# Patient Record
Sex: Male | Born: 1935 | Race: White | Hispanic: No | Marital: Married
Health system: Southern US, Community
[De-identification: ages and names within clinical notes are randomized; demographics above are authoritative.]

---

## 2003-10-25 ENCOUNTER — Ambulatory Visit (HOSPITAL_COMMUNITY): Admission: RE | Admit: 2003-10-25 | Discharge: 2003-10-26 | Payer: Self-pay | Admitting: Otolaryngology

## 2005-02-18 ENCOUNTER — Ambulatory Visit: Payer: Self-pay | Admitting: Internal Medicine

## 2005-03-20 ENCOUNTER — Ambulatory Visit: Payer: Self-pay | Admitting: Internal Medicine

## 2007-03-25 ENCOUNTER — Ambulatory Visit: Payer: Self-pay | Admitting: Internal Medicine

## 2009-08-02 ENCOUNTER — Ambulatory Visit: Payer: Self-pay | Admitting: Internal Medicine

## 2009-08-09 ENCOUNTER — Ambulatory Visit: Payer: Self-pay | Admitting: Internal Medicine

## 2009-08-16 ENCOUNTER — Ambulatory Visit: Payer: Self-pay | Admitting: Internal Medicine

## 2009-08-30 ENCOUNTER — Ambulatory Visit: Payer: Self-pay | Admitting: Internal Medicine

## 2012-11-26 ENCOUNTER — Emergency Department: Payer: Self-pay | Admitting: Emergency Medicine

## 2012-11-26 LAB — COMPREHENSIVE METABOLIC PANEL
Albumin: 3.7 g/dL (ref 3.4–5.0)
Calcium, Total: 10.1 mg/dL (ref 8.5–10.1)
Co2: 31 mmol/L (ref 21–32)
EGFR (Non-African Amer.): 36 — ABNORMAL LOW
Glucose: 161 mg/dL — ABNORMAL HIGH (ref 65–99)
Osmolality: 286 (ref 275–301)
Sodium: 139 mmol/L (ref 136–145)
Total Protein: 7.9 g/dL (ref 6.4–8.2)

## 2012-11-26 LAB — URINALYSIS, COMPLETE
Bacteria: NONE SEEN
Hyaline Cast: 4
Leukocyte Esterase: NEGATIVE
Ph: 5 (ref 4.5–8.0)
Squamous Epithelial: <1

## 2012-11-26 LAB — CBC
HCT: 41.8 % (ref 40.0–52.0)
HGB: 14 g/dL (ref 13.0–18.0)
MCH: 30.3 pg (ref 26.0–34.0)
MCHC: 33.5 g/dL (ref 32.0–36.0)
MCV: 91 fL (ref 80–100)

## 2013-03-06 ENCOUNTER — Inpatient Hospital Stay: Payer: Self-pay | Admitting: Internal Medicine

## 2013-03-06 LAB — URINALYSIS, COMPLETE
Bilirubin,UR: NEGATIVE
Blood: NEGATIVE
Glucose,UR: NEGATIVE mg/dL (ref 0–75)
Nitrite: NEGATIVE
Specific Gravity: 1.01 (ref 1.003–1.030)
Squamous Epithelial: <1

## 2013-03-06 LAB — LIPASE, BLOOD: Lipase: 148 U/L (ref 73–393)

## 2013-03-06 LAB — CK
CK, Total: 44 U/L (ref 35–232)
CK, Total: 39 U/L (ref 35–232)

## 2013-03-06 LAB — COMPREHENSIVE METABOLIC PANEL
Alkaline Phosphatase: 127 U/L (ref 50–136)
Anion Gap: 4 — ABNORMAL LOW (ref 7–16)
Bilirubin,Total: 0.3 mg/dL (ref 0.2–1.0)
Chloride: 108 mmol/L — ABNORMAL HIGH (ref 98–107)
Co2: 29 mmol/L (ref 21–32)
Creatinine: 1.58 mg/dL — ABNORMAL HIGH (ref 0.60–1.30)
Osmolality: 295 (ref 275–301)
Potassium: 4.3 mmol/L (ref 3.5–5.1)
SGOT(AST): 18 U/L (ref 15–37)
Total Protein: 6.6 g/dL (ref 6.4–8.2)

## 2013-03-06 LAB — CBC
HCT: 37 % — ABNORMAL LOW (ref 40.0–52.0)
HGB: 11.8 g/dL — ABNORMAL LOW (ref 13.0–18.0)
MCH: 28.5 pg (ref 26.0–34.0)
Platelet: 200 10*3/uL (ref 150–440)
RBC: 4.15 10*6/uL — ABNORMAL LOW (ref 4.40–5.90)
RDW: 16.8 % — ABNORMAL HIGH (ref 11.5–14.5)

## 2013-03-06 LAB — BASIC METABOLIC PANEL
Anion Gap: 3 — ABNORMAL LOW (ref 7–16)
Chloride: 110 mmol/L — ABNORMAL HIGH (ref 98–107)
Co2: 30 mmol/L (ref 21–32)
Glucose: 164 mg/dL — ABNORMAL HIGH (ref 65–99)
Potassium: 4.5 mmol/L (ref 3.5–5.1)
Sodium: 143 mmol/L (ref 136–145)

## 2013-03-06 LAB — HEMATOCRIT: HCT: 36.2 % — ABNORMAL LOW (ref 40.0–52.0)

## 2013-03-06 LAB — TROPONIN I: Troponin-I: 0.08 ng/mL — ABNORMAL HIGH

## 2013-03-06 LAB — CK-MB: CK-MB: 2.1 ng/mL (ref 0.5–3.6)

## 2013-03-06 LAB — HEMOGLOBIN: HGB: 11.7 g/dL — ABNORMAL LOW (ref 13.0–18.0)

## 2013-03-06 LAB — MAGNESIUM: Magnesium: 1.1 mg/dL — ABNORMAL LOW

## 2013-03-06 LAB — PROTIME-INR: Prothrombin Time: 15 secs — ABNORMAL HIGH (ref 11.5–14.7)

## 2013-03-07 LAB — BASIC METABOLIC PANEL
Anion Gap: 4 — ABNORMAL LOW (ref 7–16)
Calcium, Total: 9.5 mg/dL (ref 8.5–10.1)
Co2: 28 mmol/L (ref 21–32)
EGFR (Non-African Amer.): 35 — ABNORMAL LOW
Glucose: 156 mg/dL — ABNORMAL HIGH (ref 65–99)

## 2013-03-07 LAB — CBC WITH DIFFERENTIAL/PLATELET
HCT: 34.3 % — ABNORMAL LOW (ref 40.0–52.0)
HGB: 10.8 g/dL — ABNORMAL LOW (ref 13.0–18.0)
Lymphocyte #: 1.1 10*3/uL (ref 1.0–3.6)
Lymphocyte %: 3.9 %
Monocyte #: 1.1 x10 3/mm — ABNORMAL HIGH (ref 0.2–1.0)
Monocyte %: 3.8 %
RDW: 17.1 % — ABNORMAL HIGH (ref 11.5–14.5)
WBC: 27.6 10*3/uL — ABNORMAL HIGH (ref 3.8–10.6)

## 2013-03-08 LAB — CBC WITH DIFFERENTIAL/PLATELET
Basophil #: 0 10*3/uL (ref 0.0–0.1)
Eosinophil #: 0 10*3/uL (ref 0.0–0.7)
Lymphocyte #: 0.5 10*3/uL — ABNORMAL LOW (ref 1.0–3.6)
Lymphocyte %: 2.5 %
MCV: 88 fL (ref 80–100)
Monocyte #: 0.8 x10 3/mm (ref 0.2–1.0)
WBC: 19 10*3/uL — ABNORMAL HIGH (ref 3.8–10.6)

## 2013-03-09 LAB — BASIC METABOLIC PANEL
Calcium, Total: 9.2 mg/dL (ref 8.5–10.1)
Creatinine: 1.8 mg/dL — ABNORMAL HIGH (ref 0.60–1.30)
EGFR (African American): 39 — ABNORMAL LOW
Glucose: 195 mg/dL — ABNORMAL HIGH (ref 65–99)
Sodium: 138 mmol/L (ref 136–145)

## 2013-03-09 LAB — CBC WITH DIFFERENTIAL/PLATELET
Basophil %: 0.2 %
HGB: 9.4 g/dL — ABNORMAL LOW (ref 13.0–18.0)
Lymphocyte %: 3.6 %
MCH: 28.8 pg (ref 26.0–34.0)
Monocyte #: 0.8 x10 3/mm (ref 0.2–1.0)
Platelet: 159 10*3/uL (ref 150–440)
WBC: 15.4 10*3/uL — ABNORMAL HIGH (ref 3.8–10.6)

## 2013-03-20 DEATH — deceased

## 2013-05-01 LAB — CULTURE, BLOOD (SINGLE)

## 2014-05-12 IMAGING — CT CT ABD-PELV W/O CM
1 of 2 series · 15 of 32 positions shown, 19 images · non-contrast
Comparison: none

REASON FOR EXAM: (1) Pain, r/o perf, distended, ?sbo; (2) pain, r/o perf
COMMENTS:

[Series 2: 3mm soft tissue · axial · 0.74mm/px · z∈[-494,-82]mm · 15 of 151 slices shown, 19 images]
[im 7/151  soft-tissue]
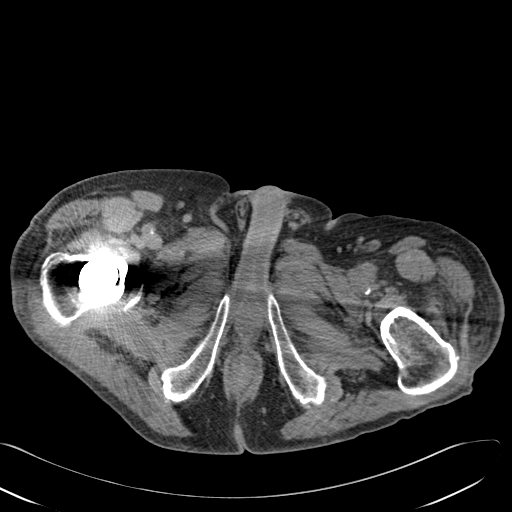
[im 7/151  bone]
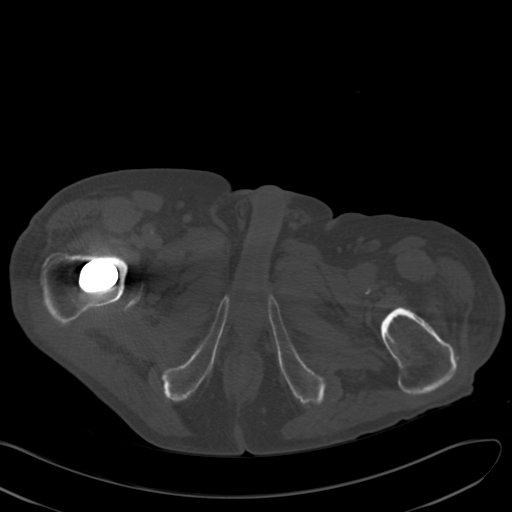
[im 21/151  soft-tissue]
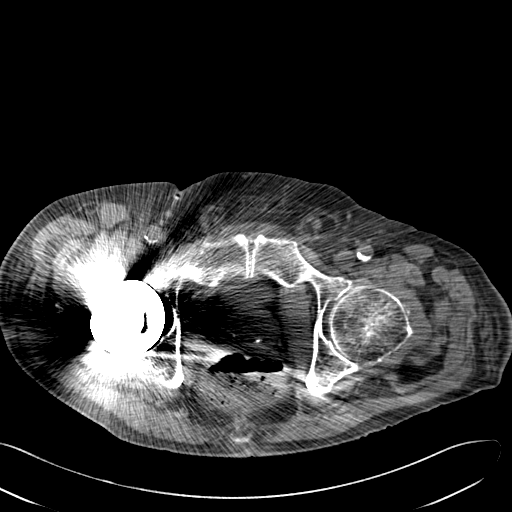
[im 35/151  soft-tissue]
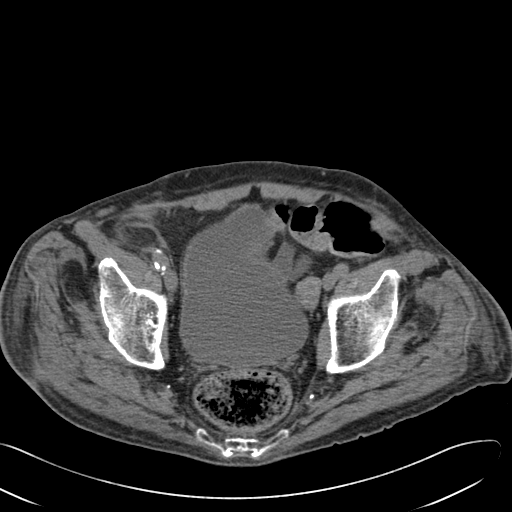
[im 41/151  soft-tissue]
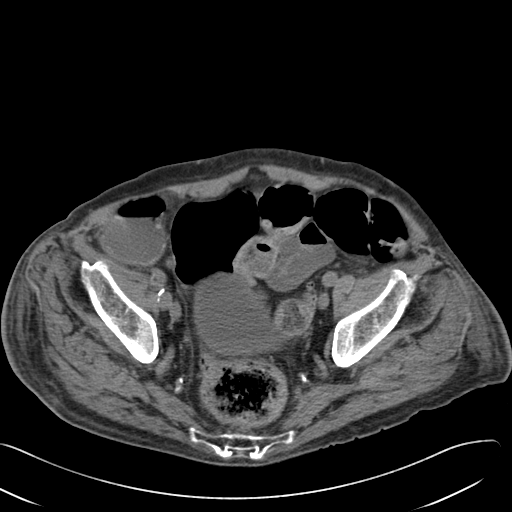
[im 55/151  soft-tissue]
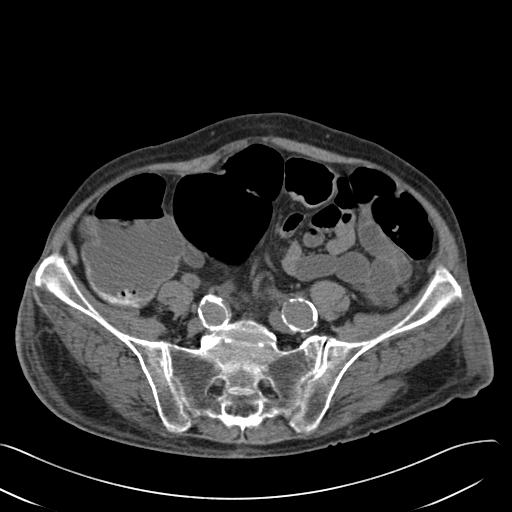
[im 62/151  soft-tissue]
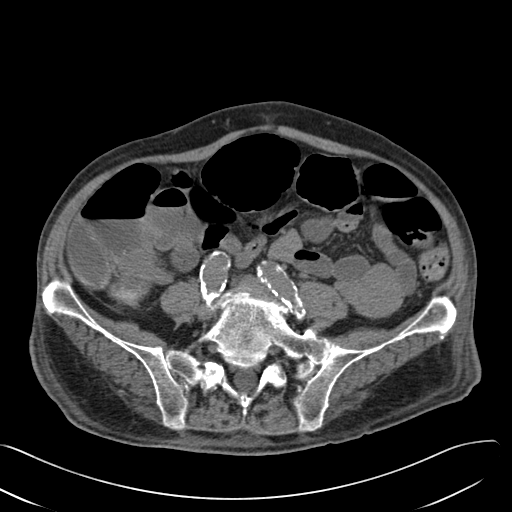
[im 76/151  soft-tissue]
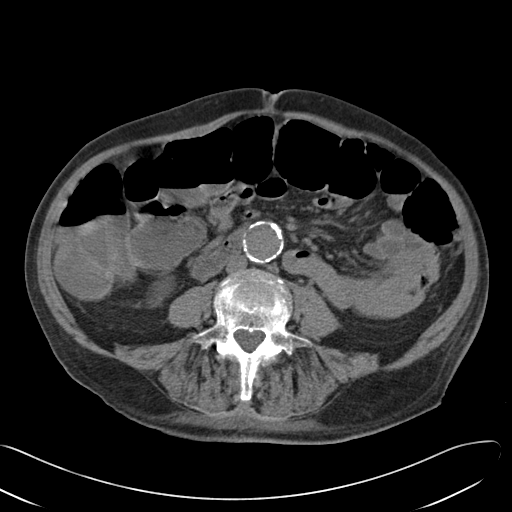
[im 89/151  soft-tissue]
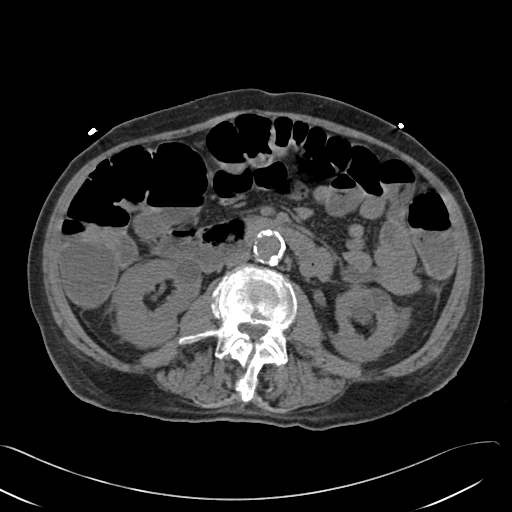
[im 96/151  soft-tissue]
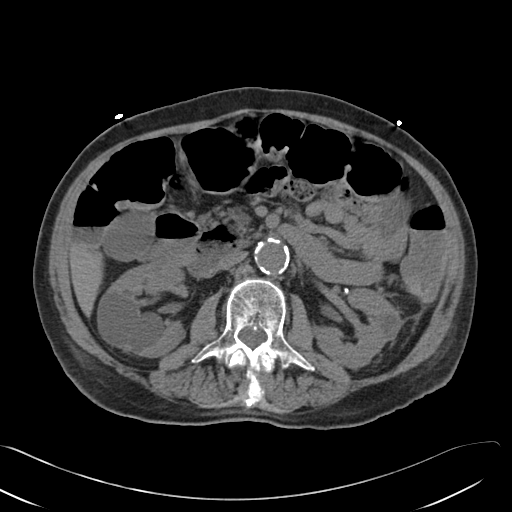
[im 96/151  bone]
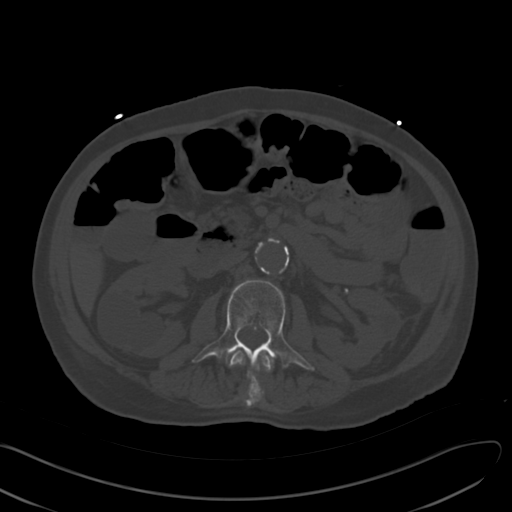
[im 110/151  soft-tissue]
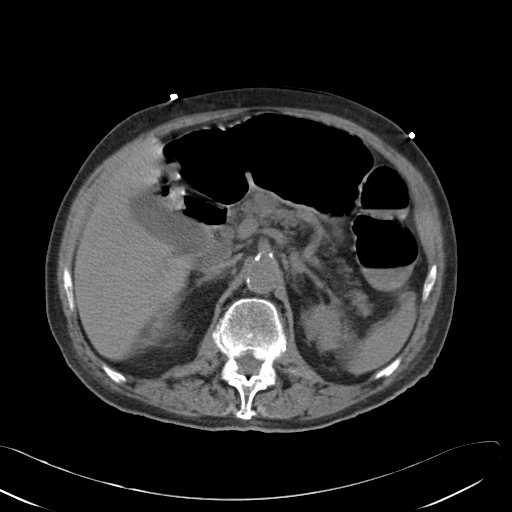
[im 116/151  soft-tissue]
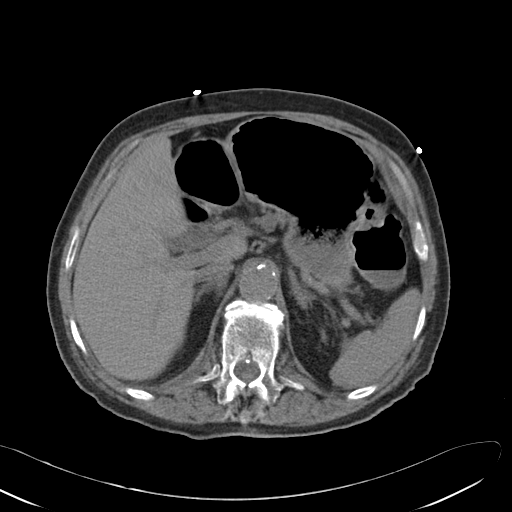
[im 123/151  lung]
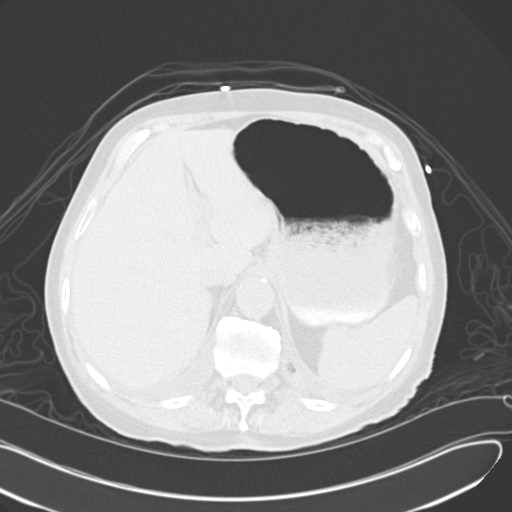
[im 130/151  soft-tissue]
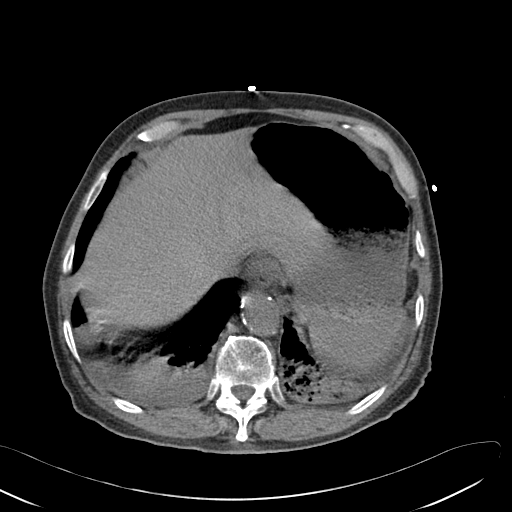
[im 130/151  lung]
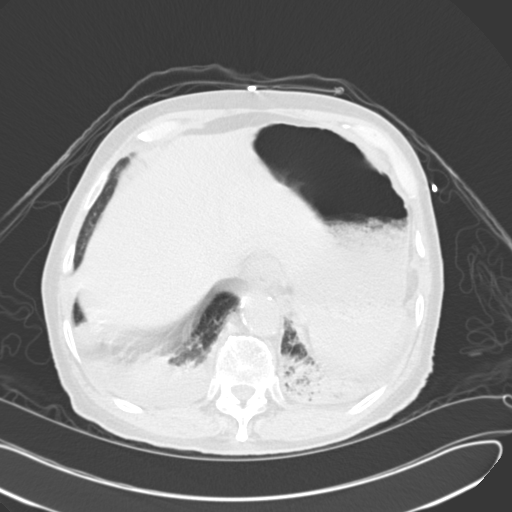
[im 137/151  lung]
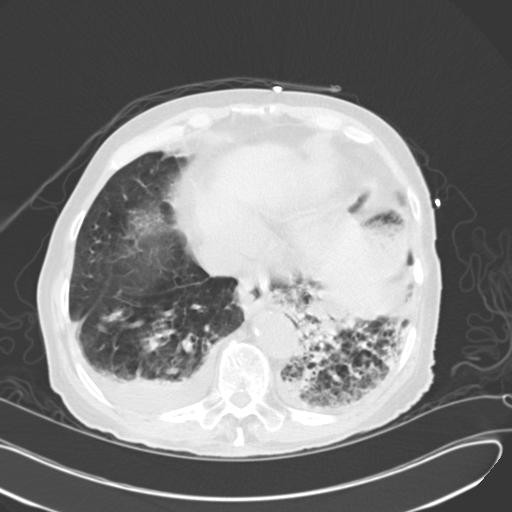
[im 144/151  soft-tissue]
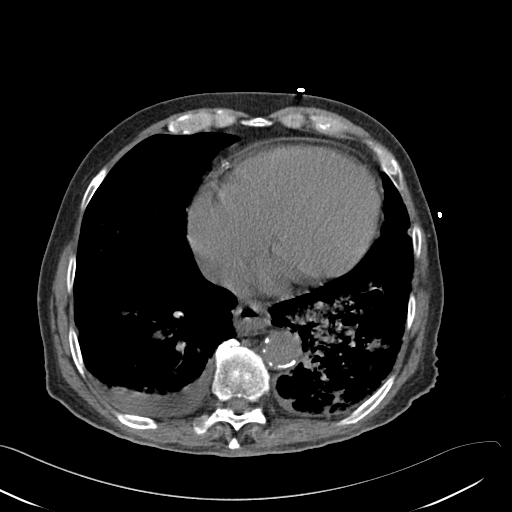
[im 144/151  lung]
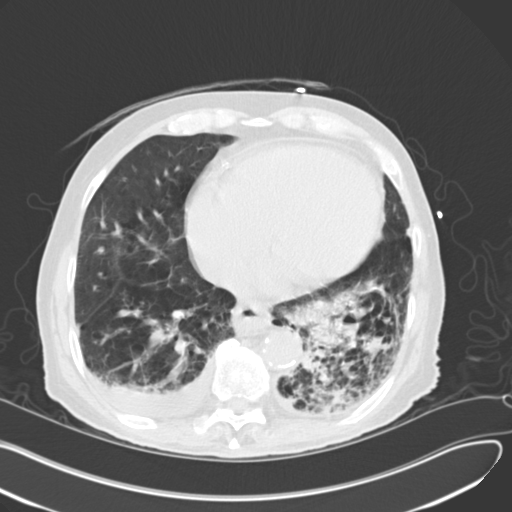

[15 of 32 positions shown; findings below may reference images not displayed]

PROCEDURE:     CT  - CT ABDOMEN AND PELVIS W[DATE]  [DATE]

RESULT:     CT of the abdomen and pelvis without oral nor IV contrast is
reconstructed at 3 mm slice thickness in the axial plane and read
millimeters slice thickness in the coronal plane. The patient has no
previous study for comparison.

A images through the base the lungs demonstrate heterogeneous opacification
in the left lower lobe with a trace to small right pleural effusion and
possible minimal left pleural fluid. Less prominent areas of consolidation
are seen in the right lung base. There is respiratory motion artifact. There
is no pneumothorax.

There is no evidence of pneumoperitoneum. Gaseous distention of the stomach
with some mildly prominent small bowel loops and air and fluid in the colon
are seen. There is a moderate amount of fecal material in the rectum and
rectosigmoid region. There is no significant ascites. There is no
adenopathy. There is a small infrarenal abdominal aortic aneurysm measuring
approximately 3 cm. Heavy atherosclerotic calcification is present. There is
evidence of an cyst in the posterior and anterior mid right kidney possibly
with a lateral cyst as well. Another cyst is seen in the upper pole the
right kidney. There multiple cysts involving the left kidney that are
slightly smaller than the largest cyst on the right. The liver and spleen
appear grossly normal although there is motion artifact area the pancreas
and adrenal glands as well as the gallbladder appear grossly normal. No
bowel wall thickening or ascites is appreciated. There is a moderately large
amount of urine in the urinary bladder.
IMPRESSION: 1. Multiple bilateral renal cysts no hydronephrosis or nephrolithiasis to
the appendix is not seen but there is no definite evidence of acute
appendicitis.
3. Probable ileus. A definite obstruction is not appreciated at this time.
4. Small infrarenal suprailiac abdominal aortic aneurysm 3 cm with
significant atherosclerotic calcification present.

[REDACTED]

## 2014-05-13 IMAGING — CR DG CHEST 1V
1 series · 1 of 1 positions shown · non-contrast
Comparison: none

REASON FOR EXAM: Infiltrates?
COMMENTS:

[ap]
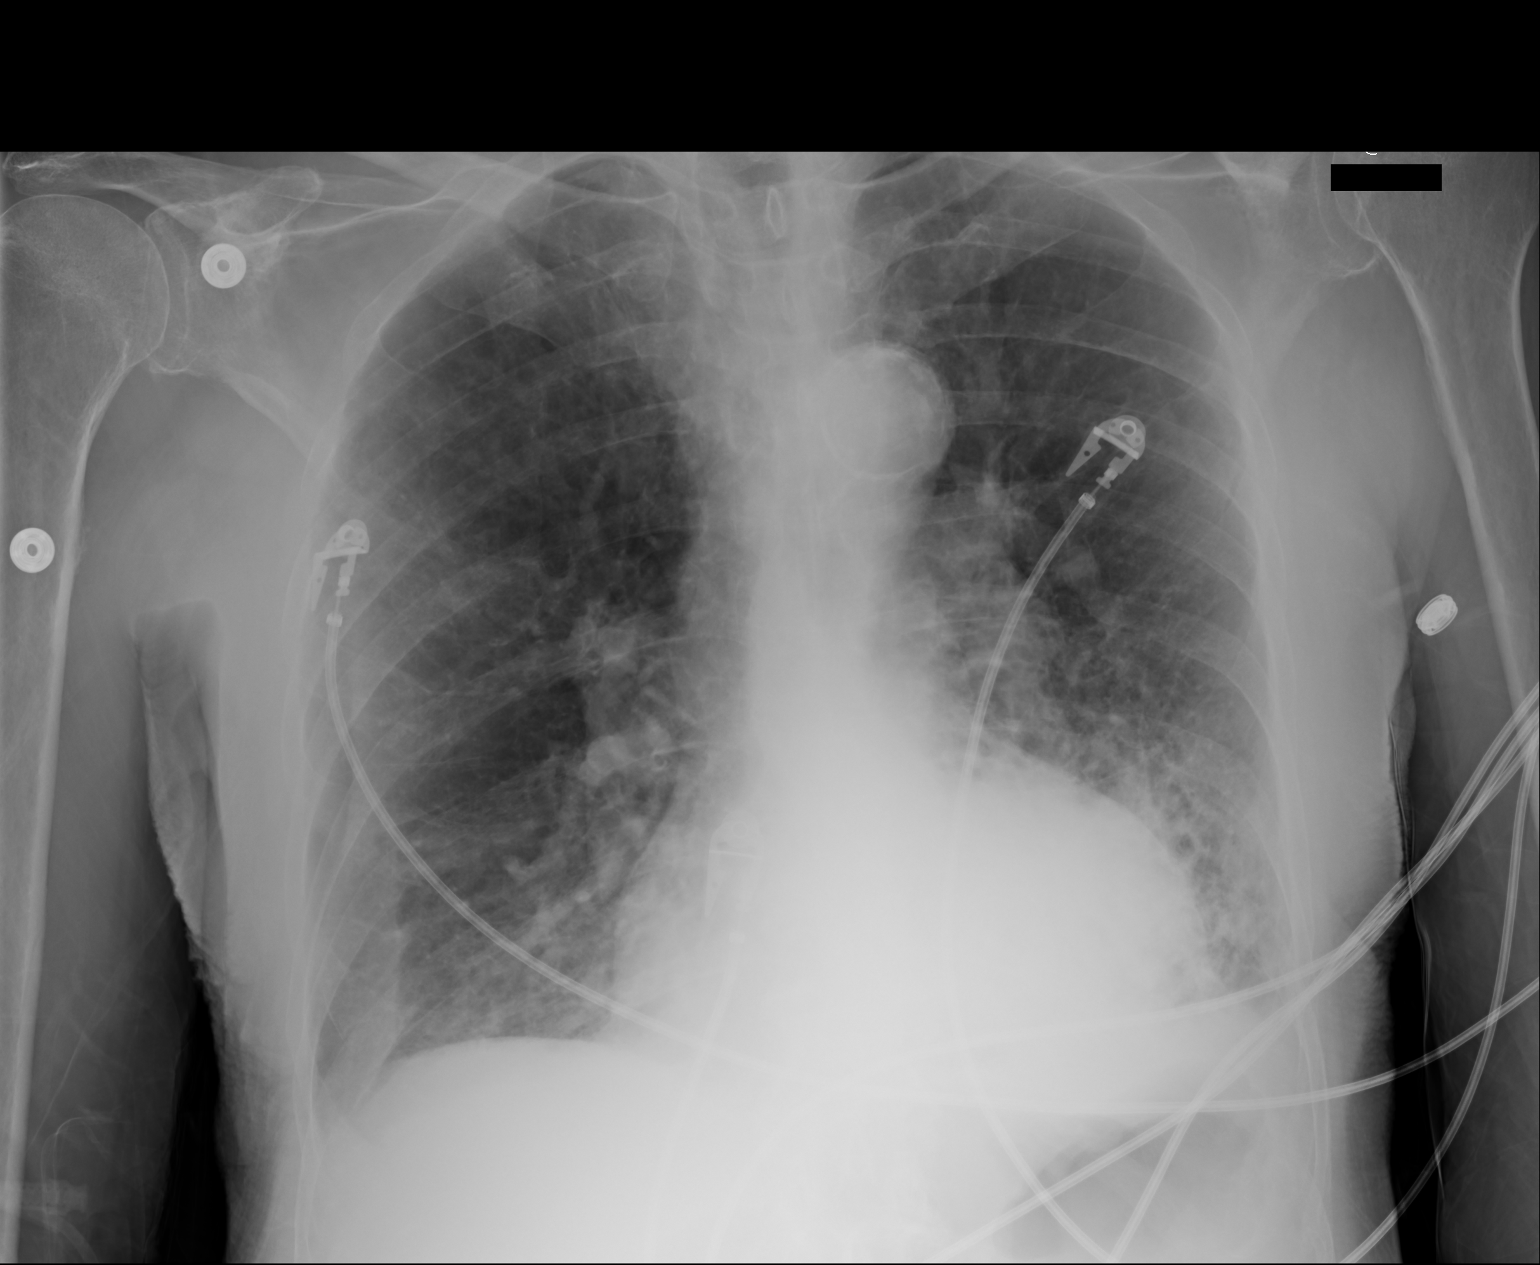

[1 of 1 positions shown; findings below may reference images not displayed]

PROCEDURE:     DXR - DXR CHEST 1 VIEWAP OR PA  - March 07, 2013 [DATE]

RESULT:     Comparison is made to the study of 11/26/2012.

There is diffuse interstitial thickening which could be edematous or
fibrotic. The heart is mildly enlarged. Atherosclerotic calcification is
present. There is no large effusion or consolidation.
IMPRESSION: COPD with fibrosis. Mild interstitial edema or an
infectious or inflammatory process is not excluded.

[REDACTED]

## 2014-09-09 NOTE — Discharge Summary (Signed)
PATIENT NAME:  Leroy Nielsen, Leroy Nielsen MR#:  [id number] DATE OF BIRTH:  [birth year]  DATE OF ADMISSION:  03/06/2013 DATE OF DISCHARGE:  06-02-12  DISCHARGE DIAGNOSES: 1.  Aspiration pneumonia.  2.  Chronic obstructive pulmonary disease, chronic respiratory failure, needs 3 liter oxygen, as baseline.  3.  Atrial fibrillation.  4.  Gastrointestinal bleed, acute blood loss anemia.  5.  Chronic kidney disease stage 3.    6.  Electrolyte imbalance.   CONDITION ON DISCHARGE: Stable.   CODE STATUS: DO NOT RESUSCITATE.   DISCHARGE MEDICATIONS: 1.  Tamsulosin 0.4 mg oral capsule once a day.  2.  Escitalopram 10 mg oral tablet once a day.  3.  Theophylline 300 mg 24-hour capsule once a day.  4.  Vitamin C 500 mg 2 times a day.  5.  KCl at 1 tablet 2 times a day.  6.  Metoprolol 75 mg once a day.  7.  Ferrous glucose 325 mg once a day.  8.  Multivitamin once a day.  9.  Senna 2 tablets once a day.  10.  Lumigan 0.01% ophthalmic solution, 1 drop each eye once a day.  11.  Tramadol 50 mg oral 4 times a day as needed for pain.  12.  ProAir 1 puff inhalation every 4 hours as needed for wheezing.  12.  Alphagan 15% ophthalmic solution, 1 drop each eye 3 times a day.  13.  DuoNeb 2.5 3 mL inhalation 4 times a day.  14.  Advair Diskus 250/50 mcg 1 puff 2 times a day.  15.  Neurontin 300 mg oral capsule once a day.  16.  Levaquin 750 mg oral tablet every 48 hours for 3 days.  17.  Oxygen delivery 3 liters nasal cannula.   DIET: Regular, diet consistency regular.   ACTIVITY: As tolerated.    TIME FRAME TO FOLLOWUP:  1 to 2 weeks.    ____________________________ Hope PigeonVaibhavkumar G. Elisabeth PigeonVachhani, MD vgv:cs D: 06-02-12 14:36:00 ET Nielsen: 06-02-12 15:01:53 ET JOB#: [id number]  cc: Hope PigeonVaibhavkumar G. Elisabeth PigeonVachhani, MD, <Dictator> Altamese DillingVAIBHAVKUMAR Dennise Raabe MD ELECTRONICALLY SIGNED 03/17/2013 13:58

## 2014-09-09 NOTE — H&P (Signed)
PATIENT NAME:  Leroy Nielsen, Leroy Nielsen MR#:  [id number] DATE OF BIRTH:  Oct 13, 1927  DATE OF ADMISSION:  03/06/2013  PRIMARY CARE PHYSICIAN: Vernie Shanks.  REFERRING PHYSICIAN: Caryn Bee A. Paduchowski, MD  HISTORY OF PRESENT ILLNESS: The patient is an 81 year old Caucasian male with past medical history of chronic atrial fibrillation, is on Eliquis, end-stage emphysema, on 3 liters of home oxygen, hypertension, diabetes mellitus, who was sent over to the emergency room after he has been vomiting blood. His symptoms started yesterday afternoon, and he has vomited at least 12 times in the past 10 hours. After the patient had 2 large-volume hematemesis episodes, nursing home called family, and EMS was called, and the patient is sent over to the ER. The patient's stool was strongly guaiac-positive. There is no dizziness or loss of consciousness. The patient denies any abdominal pain. The patient is a poor historian and lethargic. No family members are at bedside during my examination. His hematemesis was assumed to be from Eliquis, which he has been taking for chronic atrial fibrillation. The patient's hemoglobin is at 11.8 and hematocrit is 37.0. As the patient's abdomen is rigid and has tympanic as well as decreased bowel sounds, CAT scan of the abdomen and pelvis was ordered, which has revealed ileus, but no obstruction or free air. The patient is a poor historian, and I was unable to get good history from the patient.   PAST MEDICAL HISTORY:  1. End-stage COPD with emphysema, with chronic respiratory failure, lives on 3 liters of oxygen via nasal cannula.  2. Diabetes mellitus.  3. Benign prostatic hypertrophy.  4. Chronic atrial fibrillation, recently started on Eliquis. 5. Hypertension.   PAST SURGICAL HISTORY: Coronary artery bypass grafting.   ALLERGIES: HE IS ALLERGIC TO DEMEROL AND PENICILLIN.   PSYCHOSOCIAL HISTORY: Residing at nursing home. No history of smoking, alcohol or illicit drug usage.    FAMILY HISTORY: Hypertension runs in his family.   HOME MEDICATIONS:  1. Tylenol 325 mg 2 tablets p.o. q.4 hours.  2. Tramadol 50 mg 4 times a day as needed for pain. 3. Tamsulosin 0.4 mg 1 capsule p.o. once daily.  4. Theophylline 1 capsule p.o. once daily. 5. Protonix 40 mg once daily.  6. ProAir 2 puff inhalation q.4 hours as needed. 7. Neurontin 300 mg once a day.  8. Metoprolol 75 mg once daily.  9. Hydralazine 25 mg 3 times a day.  10. Citalopram 10 mg once daily. 11. Eliquis 2.5 mg 1 tablet p.o. 2 times a day. 12. Avodart 0.5 mg 1 capsule p.o. once daily.  13. Aspirin 81 mg once daily.  14. Advair 250/50 one puff inhalation 2 times a day.  REVIEW OF SYSTEMS: CONSTITUTIONAL: Denies any fever, but complaining of fatigue and weakness.  ENT: Denies epistaxis, discharge.  RESPIRATION: Denies cough. Has COPD.  CARDIOVASCULAR: No chest pain or palpitations.  GASTROINTESTINAL: Complaining of bloody vomiting. No nausea. No abdominal pain. No melena, but heme-positive stool.  GENITOURINARY: No dysuria or hematuria.  ENDOCRINE: Denies polyuria or nocturia. Has positive diabetes mellitus.  HEMATOLOGIC AND LYMPHATIC: No anemia, bruising or bleeding.  MUSCULOSKELETAL: No joint pain in neck and back. Denies gout.  NEUROLOGIC: No vertigo or ataxia.  PSYCHIATRIC: Denies ADD, OCD.   PHYSICAL EXAMINATION:  VITAL SIGNS: Temperature 99.5, pulse 102, respiration 26, blood pressure 149/55, pulse oximetry 96%.  GENERAL APPEARANCE: Not under acute distress. Moderately built and nourished.  HEENT: Normocephalic, atraumatic. Pupils are equally reacting to light and accommodation. No scleral icterus. No conjunctival injection.  No sinus tenderness. Moist mucous membranes.  NECK: Supple. No JVD. No thyromegaly.  LUNGS: Positive crackles and rales.  CARDIAC: Irregularly irregular. No murmurs.  GASTROINTESTINAL: Rigid, distended. Decreased bowel sounds. Bowel sounds are tympanic in nature. No  abdominal tenderness. No guarding.  NEUROLOGIC: Awake and alert, but lethargic. Answers most of the questions appropriately, but poor historian. Reflexes are 2+. Motor and sensory are grossly intact.  EXTREMITIES: No edema. No cyanosis. No clubbing.  MUSCULOSKELETAL: No joint effusion, tenderness or erythema.  SKIN: Warm to touch. Normal turgor. No rashes. No lesions  VASCULAR: Palpable dorsalis pedis and posterior tibialis pulses.  PSYCHIATRIC: Flat mood and affect.   LABORATORY AND IMAGING STUDIES: A 12-lead EKG: Sinus tachycardia with frequent premature ventricular complexes, left axis deviation, right bundle branch block. CAT scan of the abdomen and pelvis without IV contrast has revealed gas-filled loops of large and small bowels which may represent ileus. No obstruction or free air. Right-sided effusion. Patchy consolidation in both lung bases, greater on the left than on the right, which may represent atelectasis or pneumonia. LFTs are normal except albumin which is low at 2.9. CK 51, CPK-MB 3.8. WBC 18.3, hemoglobin 11.8, hematocrit 37.0, platelets 200, O positive, antibody negative. PT 15.0, INR 1.2. Glucose 254, BUN 28, creatinine 1.58, sodium 141, potassium 4.3, chloride 108, CO2 29, GFR 40, anion gap 4, serum osmolality 295, calcium 9.7, lipase 148.   ASSESSMENT AND PLAN: An 81 year old Caucasian male brought into the ER from nursing home with 10 to 12 episodes of hematemesis and 2 large voluminous episodes of hematemesis in the last 10 hours. Will be admitted with the following assessment and plan.   1. Upper gastrointestinal bleed probably from Eliquis, which has prolonged half-life. Will discontinue Eliquis.  Will keep him n.p.o. Provide him IV fluids and Protonix drip.  Monitor hemoglobin and hematocrit q.8 hours.  GI consult is placed to Dr. Marva PandaSkulskie and notified. He is aware. He will see the patient as soon as possible.  2. Aspiration pneumonia. Will provide him meropenem and  Levaquin. Cultures are ordered.  3. Detectable troponin, probably from demand ischemia. Will monitor cardiac enzymes. The patient denies any chest pain.  4. Ileus. The patient is not currently vomiting. His last bowel movement was yesterday. Will monitor closely, and if necessary, will put a surgical consult. Will decompress with nasogastric tube as-needed basis.  5. Chronic atrial fibrillation, currently rate controlled. Eliquis is discontinued in view of hematemesis.  6. Type 2 diabetes mellitus. The patient being n.p.o., will be on sliding scale insulin.   CODE STATUS: He is DNR.   TOTAL CRITICAL CARE TIME SPENT: 50 minutes.   ____________________________ Ramonita LabAruna Sacha Radloff, MD ag:lb D: 03/06/2013 08:12:00 ET T: 03/06/2013 09:00:35 ET JOB#: [id number]  cc: Ramonita LabAruna Eladia Frame, MD, <Dictator> Lovena NeighboursBarbara Matthews Martin U. Skulskie, MD Ramonita LabARUNA Naja Apperson MD ELECTRONICALLY SIGNED 03/19/2013 1:58

## 2014-09-09 NOTE — Consult Note (Signed)
Chief Complaint:  Subjective/Chief Complaint seen for hematemesis.  not recurrent, hemodynamically stable.  denies abdominal pain or nausea, tolerating clears.   VITAL SIGNS/ANCILLARY NOTES: **Vital Signs.:   20-Oct-14 17:03  Vital Signs Type Routine  Temperature Temperature (F) 97.3  Celsius 36.2  Temperature Source oral  Pulse Pulse 97  Respirations Respirations 20  Systolic BP Systolic BP 106  Diastolic BP (mmHg) Diastolic BP (mmHg) 55  Mean BP 72  Pulse Ox % Pulse Ox % 90  Pulse Ox Activity Level  At rest  Oxygen Delivery 3L  *Intake and Output.:   20-Oct-14 03:15  Stool  MODERATE LOOSE STOOL.  NO SIGNS OF BLOOD.    12:29  Stool  Patient had a small loose bowel movement.    13:26  Stool  Patient had a small loose bowel movement.    17:44  Stool  Patient had a medium sized loose dark bowel movement.   Brief Assessment:  Cardiac Irregular   Respiratory coarse rhonchi/wheezes   Gastrointestinal details normal Soft  Nontender  Nondistended  Bowel sounds normal   Lab Results: Routine Hem:  20-Oct-14 06:44   WBC (CBC)  19.0  RBC (CBC)  3.28  Hemoglobin (CBC)  9.4  Hematocrit (CBC)  29.0  Platelet Count (CBC) 150  MCV 88  MCH 28.6  MCHC 32.3  RDW  16.9  Neutrophil % 93.0  Lymphocyte % 2.5  Monocyte % 4.1  Eosinophil % 0.1  Basophil % 0.3  Neutrophil #  17.6  Lymphocyte #  0.5  Monocyte # 0.8  Eosinophil # 0.0  Basophil # 0.0 (Result(s) reported on 08 Mar 2013 at 07:01AM.)   Assessment/Plan:  Assessment/Plan:  Assessment 1) hematemesis-not recurrent.  dark stool not melena.  hemodynamically stable.  tolerating clears   Plan 1) advance diet to full liquids in am, continue bid ppi, possible d/c tomorrow.  will need o/p gi fu to arrange upper gi series in about 2 weeks.   Electronic Signatures: Barnetta ChapelSkulskie, Kaitlen Redford (MD)  (Signed 20-Oct-14 21:22)  Authored: Chief Complaint, VITAL SIGNS/ANCILLARY NOTES, Brief Assessment, Lab Results, Assessment/Plan   Last  Updated: 20-Oct-14 21:22 by Barnetta ChapelSkulskie, Tymar Polyak (MD)

## 2014-09-09 NOTE — Consult Note (Signed)
Chief Complaint:  Subjective/Chief Complaint seen for hematemesis.  no recurrent emesis. one large loose bm, brown.   n/v or abd pain.   VITAL SIGNS/ANCILLARY NOTES: **Vital Signs.:   19-Oct-14 07:00  Temperature Temperature (F) 97.6  Celsius 36.4  Temperature Source oral  Pulse Pulse 110  Respirations Respirations 21  Pulse Ox % Pulse Ox % 97  Oxygen Delivery 3L; Nasal Cannula  Pulse Ox Heart Rate 100  *Intake and Output.:   19-Oct-14 10:07  Stool  large loose brn   Brief Assessment:  Cardiac Irregular   Respiratory clear BS   Gastrointestinal details normal Soft  Nontender  Nondistended  Bowel sounds normal   Lab Results: Routine Chem:  18-Oct-14 00:34   BUN  28    14:38   BUN  31  19-Oct-14 04:59   Magnesium, Serum  1.7 (1.8-2.4 THERAPEUTIC RANGE: 4-7 mg/dL TOXIC: > 10 mg/dL  -----------------------)  Glucose, Serum  156  BUN  32  Creatinine (comp)  1.76  Sodium, Serum 143  Potassium, Serum 4.3  Chloride, Serum  111  CO2, Serum 28  Calcium (Total), Serum 9.5  Anion Gap  4  Osmolality (calc) 295  eGFR (African American)  41  eGFR (Non-African American)  35 (eGFR values <45m/min/1.73 m2 may be an indication of chronic kidney disease (CKD). Calculated eGFR is useful in patients with stable renal function. The eGFR calculation will not be reliable in acutely ill patients when serum creatinine is changing rapidly. It is not useful in  patients on dialysis. The eGFR calculation may not be applicable to patients at the low and high extremes of body sizes, pregnant women, and vegetarians.)  Routine Hem:  18-Oct-14 00:34   Hemoglobin (CBC)  11.8    08:59   Hemoglobin (CBC)  11.7 (Result(s) reported on 06 Mar 2013 at 09:31AM.)  19-Oct-14 04:59   WBC (CBC)  27.6  RBC (CBC)  3.83  Hemoglobin (CBC)  10.8  Hematocrit (CBC)  34.3  Platelet Count (CBC) 157  MCV 90  MCH 28.2  MCHC  31.4  RDW  17.1  Neutrophil % 92.1  Lymphocyte % 3.9  Monocyte % 3.8   Eosinophil % 0.0  Basophil % 0.2  Neutrophil #  25.4  Lymphocyte # 1.1  Monocyte #  1.1  Eosinophil # 0.0  Basophil # 0.0 (Result(s) reported on 07 Mar 2013 at 05:32AM.)   Radiology Results: XRay:    18-Oct-14 03:22, Abdomen 3 Way Includes PA Chest  Abdomen 3 Way Includes PA Chest   REASON FOR EXAM:    gi bleed, fever, r/o perf  COMMENTS:       PROCEDURE: DXR - DXR ABDOMEN 3-WAY (INCL PA CXR)  - Mar 06 2013  3:22AM     RESULT: The heart is mildly enlarged. Atherosclerotic calcification is   present within the aortic arch. There is diffuse prominence of the lung   markings especially in the lung bases. The appearance may with slight   underlying chronic emphysematous disease with fibrosis. Superimposed   edema or infectious or inflammatory component or not excluded. Abdominal  images show air within the stomach, colon and small bowel to the rectum   without obstructive change. Degenerative changes are seen in the spine.   There is a right hip hemiarthroplasty present. There is no pneumatosis or   pneumoperitoneum.  IMPRESSION:   1. Likely severe chronic underlying pulmonary disease. Superimposed   infectious or inflammatory process is not excluded. Mild cardiomegaly   with atherosclerotic  changes.  2. No definite bowel obstruction. Correlate for ileus. No evidence of  perforation.    Dictation Site: 1        Verified By: Sundra Aland, M.D., MD  CT:    18-Oct-14 04:01, CT Abdomen and Pelvis Without Contrast  CT Abdomen and Pelvis Without Contrast   REASON FOR EXAM:    (1) Pain, r/o perf, distended, ?sbo; (2) pain, r/o   perf  COMMENTS:       PROCEDURE: CT  - CT ABDOMEN AND PELVIS W0  - Mar 06 2013  4:01AM     RESULT: CT of the abdomen and pelvis without oral nor IV contrast is   reconstructed at 3 mm slice thickness in the axial plane and read   millimeters slice thickness in the coronal plane. The patient has no   previous study for comparison.    A images  through the base the lungs demonstrate heterogeneous   opacification in the left lower lobe with a trace to small right pleural   effusion and possible minimal left pleural fluid. Less prominent areas of   consolidation are seen in the right lung base. There is respiratory     motion artifact. There is no pneumothorax.    There is noevidence of pneumoperitoneum. Gaseous distention of the   stomach with some mildly prominent small bowel loops and air and fluid in   the colon are seen. There is a moderate amount of fecal material in the   rectum and rectosigmoid region. There is no significant ascites. There is   no adenopathy. There is a small infrarenal abdominal aortic aneurysm   measuring approximately 3 cm. Heavy atherosclerotic calcification is   present. There is evidence of an cyst in the posterior and anterior mid   right kidney possibly with a lateral cyst as well. Another cyst is seen   in the upper pole the right kidney. There multiple cysts involving the   left kidney that are slightly smaller than the largest cyst on the right.   The liver and spleen appear grossly normal although there is motion   artifact area the pancreas and adrenal glands as well as the gallbladder   appear grossly normal. No bowel wall thickening or ascites is     appreciated. There is a moderately large amount of urine in the urinary   bladder.    IMPRESSION:   1. Multiple bilateral renal cysts no hydronephrosis or nephrolithiasis to   the appendix is not seen but there is no definite evidence of acute   appendicitis.  3. Probable ileus. A definite obstruction is not appreciated at this time.  4. Small infrarenal suprailiac abdominal aortic aneurysm 3 cm with   significant atherosclerotic calcification present.    Dictation Site: 1        Verified By: Sundra Aland, M.D., MD   Assessment/Plan:  Assessment/Plan:  Assessment 1) hematemesis-not recurreent.  bm without melena.  hemodynamically stable, labs stable.  2) severe copd/chronic respiratory failure.   Plan 1) continue iv ppi drip.  will change to iv bid tomorrow, will start some clears.  ok to take to floor.  Following.   Electronic Signatures: Loistine Simas (MD)  (Signed 19-Oct-14 11:41)  Authored: Chief Complaint, VITAL SIGNS/ANCILLARY NOTES, Brief Assessment, Lab Results, Radiology Results, Assessment/Plan   Last Updated: 19-Oct-14 11:41 by Loistine Simas (MD)

## 2014-09-09 NOTE — Consult Note (Signed)
Referring ER MDDr LAmbDr Nelva NayPeter Kunnis [phone] Right femoral fracture s/p mechanical fall at homebend down to pick up newsapaer and lost balance. denies LOC, syncopecp or any other injury stage COPD/severe emphysema on home oxygen (3 liters [place])oral inhalersrisk for surgery from pulmonary standpointson spoke with his pulmonary dr Cy Blamerkunnis and [facility]. Dm2lantus cartia XT  pt's son and ER MD will try to facilitate transfer to [facility] 55 mins  Electronic Signatures: Willow OraPatel, Greig Altergott A (MD)  (Signed on 10-Jul-14 13:01)  Authored  Last Updated: 10-Jul-14 13:01 by Willow OraPatel, Fredrick Geoghegan A (MD)

## 2014-09-09 NOTE — Consult Note (Signed)
PATIENT NAME:  Leroy Nielsen, Leroy Nielsen MR#:  [id number] DATE OF BIRTH:  1928/04/29  DATE OF CONSULTATION:  03/06/2013  CONSULTING PHYSICIAN:  Christena Deem, MD  Patient of Dr. Heron Nay   REASON FOR CONSULTATION: Hematemesis.   HISTORY OF PRESENT ILLNESS: Leroy Nielsen is an 81 year old Caucasian male, who was brought to the Emergency Room last night after multiple episodes of hematemesis at [facility]. He was there doing rehab. He is accompanied by Leroy son and daughter-in-law in the room, and I was able to discuss multiple aspects of care and history. The patient himself is somewhat hard of hearing, and perhaps some amount of dementia. The patient has a recent medical history of a fall with a broken hip in July of this year. He spent about 10 to 12 days at [facility] with a hip surgery, then followed with rehab. On about August 30, he was taken back to [facility] with an exacerbation of CHF and pneumonia. He has a history of severe COPD. While he was at [facility], he did experience some nausea as well as a dark emesis. He did not undergo EGD at that time. He has been in rehab at [facility] since that time. He was noted to have multiple episodes of a dark emesis yesterday. Leroy last episode of hematemesis/coffee-ground emesis was about 3:00 a.m. this morning in the Emergency Room. He has no history of previous EGD or colonoscopy. He apparently has no history of peptic ulcer disease. He has had no emesis since 3:00 a.m. this morning. He has not passed any stools since he came to the hospital, and apparently did not have any black stools at [facility]. He currently denies any nausea or abdominal pain.   PAST MEDICAL HISTORY:  1. The patient has a history of end-stage COPD with emphysema, chronic respiratory failure, and is on 3 liters oxygen via nasal cannula.  2.  He has a history of diabetes mellitus.  3.  BPH.   4. Chronic atrial fibrillation. He was recently begun on Eliquis.  This agent has been discontinued under this clinical setting.  5.  He has a history of hypertension.  6. There is a history of coronary artery bypass graft.   SOCIAL HISTORY:  He currently resides in a nursing home.   FAMILY HISTORY:  GI family history is uncertain. He and Leroy son are the only surviving members of Leroy family.   OUTPATIENT MEDICATIONS: Include Advair Diskus 250/50 twice a day, Alphagan eye drops, aspirin 81 mg, Avodart 0.5 mg once a day, DuoNeb 2.5 mg/0.5 mg 4 times a day, Eliquis 2.5 mg twice a day, escitalopram 10 mg a day, ferrous gluconate 325 mg oral tablet once a day, hydralazine 25 mg 3 times a day, uncertain topical ointment twice a day, Lumigan eye drops 0.01% each eye daily, melatonin 3 mg at bedtime, metoprolol tartrate 75 mg once a day MiraLAX once a day, Neurontin 300 mg once a day at bedtime, Novolin R insulin, potassium chloride 20 mEq twice a day, ProAir HFA every 4 hours, promethazine 25 mg every 4 hours p.r.n., Protonix 40 mg once a day, senna S 50/8.6 mg 2 tablets once a day at bedtime, tamsulosin 0.4 mg once a day, Theo-24, 300 mg per 24 hour oral capsule once a day, Tradjenta 5 mg once a day, tramadol 50 mg 4 times a day p.r.n., Trusopt ophthalmic solution, Tussin cough syrup, Tylenol 325 mg 2 every 4 hours p.r.n. for pain.   REVIEW OF  SYSTEMS: Ten systems were reviewed per admission history and physical, agree with same.   PHYSICAL EXAMINATION: VITAL SIGNS: Temperature is 98.1, pulse 106, respirations 27, blood pressure 122/53, pulse oximetry 96%.  GENERAL: He is an elderly-appearing, 81 year old male, no acute distress, resting quietly on the bed. He denies any pain or nausea.  HEENT:  Normocephalic, atraumatic.  EYES:  Anicteric.  NOSE:  Septum midline.  OROPHARYNX: Poor dentition.  NECK:  Supple. No JVD.  HEART:  Regular rate and rhythm.  LUNGS: Show clear to coarse occasional rhonchi and wheeze.  ABDOMEN: Soft. Leroy musculature is somewhat firm. He is  nontender. He is not distended. Bowel sounds are positive and normoactive.  EXTREMITIES:  No clubbing, cyanosis, or edema.  NEUROLOGICAL:  Cranial nerves II through XII grossly intact.    RECTAL:  Anorectal exam is deferred.   LABORATORIES INCLUDE THE FOLLOWING:  On coming to the Emergency Room last night, he had a glucose of 254, BUN 28, creatinine 1.58, sodium 141, potassium 4.3, chloride 108, bicarb 29, calcium 9.7, lipase 148. Leroy hepatic profile was normal, with the exception of a low albumin at 2.9. He has had several cardiac enzymes. He had a troponin I of 0.07 and 0.08, respectively. Leroy hemogram on admission showed a white count of 18.3, H and H 11.8/37.0, platelet count of 200. Leroy labs were rechecked this afternoon, with a BUN of 31, creatinine 1.71. He had a hemoglobin this morning of 11.7. Leroy pro time was 15.0, INR of 1.2. Urinalysis showing negative blood, 25 mg/dL protein. It is now about 7:52 p.m., and review of Leroy Is and Os indicate no episodes of emesis or melena, with the exception of one large bowel movement this morning at 0930. That apparently was not melena.   ASSESSMENT:  1. Hematemesis/coffee-ground emesis in the setting of patient taking Eliquis for atrial fibrillation. The patient denies any abdominal pain. I had a long discussion with patient's son, patient, and son's wife. I did give them several options in regards to deference to the patient's status of severe COPD and chronic respiratory failure. The patient's son told me that several years ago, he, Leroy Nielsen, and Leroy Nielsen's long-term pulmonologist had a discussion, and they all settled on the fact that there was to be no intubation at any point. I did discuss with them that sedated procedure would mean that the possibility of intubation was there, since those types of orders are suspended during sedated procedures. I gave the family and patient 2 management options. One would be to take him to EGD likely tomorrow or later  today if needed. However, this would require sedation. The second option would be to continue to watch him, serial hemoglobins, support him with IV fluids, continue IV proton pump inhibitor, and transfuse as needed. In 10 days to 2 weeks, we would do an upper GI series that would allow us to define any residual lesions in the stomach, such as neoplastic lesions. They wish to hold on doing an EGD at this time. I did discuss with them that we could change management in this regard at any point, if it was so desired. The patient's son seems to be quite satisfied with holding off on EGD, as this does represent a risk to him in regards to Leroy history of severe chronic respiratory failure. Again, on Leroy recent hospitalization at [facility], they evidently opted to avoid EGD at that time as well. As such, will recommend serial hemoglobins. Continue IV Protonix drip. Transfuse  as needed.   Will follow.    ____________________________ Christena Deem, MD mus:mr D: 03/06/2013 20:00:28 ET T: 03/06/2013 20:20:37 ET JOB#: [id number]  cc: Christena Deem, MD, <Dictator> Christena Deem MD ELECTRONICALLY SIGNED 03/20/2013 15:42

## 2014-09-09 NOTE — Discharge Summary (Signed)
PATIENT NAME:  Leroy Nielsen, Leroy Nielsen MR#:  [id number] DATE OF BIRTH:  03/29/1928  DATE OF ADMISSION:  03/06/2013 DATE OF DISCHARGE:  03/09/213  ADDENDUM: Advised on discharge to follow up with GI clinic, Dr. Marva PandaSkulskie, in 2 weeks to have further work-up for GI bleed and advised to not take any anticoagulation as he had GI bleed.   HISTORY OF PRESENT ILLNESS: On 18th of October, see H and P done by Dr. Amado CoeGouru.  An 81 year old male with past medical history of chronic atrial fibrillation on Eliquis with end-stage emphysema on 3 liters of home oxygen, hypertension and diabetes sent to the Emergency Room after he was vomiting blood, 12 times in the past 10 hours as per the records, and after he had 2 large volume hematemesis episodes the nursing home called family and they called EMS to send him to ER. Stool was strongly guaiac-positive. No dizziness or loss of consciousness. No abdominal pain. The patient was a poor historian due to overall dementia. Hemoglobin was 11.8 and there was no abdominal pain. He was admitted for further management. Initially his hemoglobin was followed more frequently and it dropped to 10, finally came down to 9.5. GI consult was called in and he was monitored on Protonix IV drip in CCU initially but then as hemoglobin was stable he was transferred to the regular floor. The patient was taking Eliquis for atrial fibrillation so GI decided to stop it and to do further work-up after a few days or a week in clinic. The patient's hemoglobin remained stable and his diet was gradually upgraded to liquid and then soft diet. He tolerated it very well. Hemoglobin is 9.5 on discharge and stable.   Other medical issues:  1.  The patient had some finding of pneumonia on chest x-ray and he was feeling a little short of breath. His baseline was 3 liters nasal cannula oxygen used at nursing home. We assumed it was aspiration pneumonia because of vomiting issue. Initially he was started on Levaquin and  Meropenem but after 3 days we stopped Meropenem and observed him for 24 hours. His white cell count, which was elevated when he came in, gradually came down and stayed down even after stopping Meropenem so we are discharging him on Levaquin to finish the course of 7 days.  2.  Detectable troponin. Troponin was slightly elevated but on further follow-up it remained stable and same range so we ruled out any ischemic heart disease at this time.  3.  Atrial fibrillation. The patient was taking Eliquis. We stopped and rate control was there by medication. He remained stable and he is not a candidate for anticoagulant because of GI bleed now.  4.  Electrolyte imbalance, hypomagnesemia and hypokalemia. We replaced in hospital and continued replacing after discharge.   CONSULTANTS IN HOSPITAL: Dr. Marva PandaSkulskie for GI.  IMPORTANT LAB AND DIAGNOSTIC RESULTS: Glucose 254 and creatinine 1.58 on presentation which remained at 1.8 at the time of discharge. Troponin was 0.07 which went to 0.08 and stable. WBC was 18.3 and hemoglobin was 11.8 on admission and gradually WBC went up to 27.6 and hemoglobin came down to 9.4 and remained stable. WBC gradually came down to 15.4 on the day of discharge.  Chest x-ray showed COPD with fibrosis, mild interstitial edema. An infectious or inflammatory process is not excluded. Abdomen, 3-way, shows severe chronic underlying pulmonary disease. No definite bowel obstruction. No evidence of perforation.   TOTAL TIME SPENT ON THIS DISCHARGE: 40 minutes.  ____________________________ Heath GoldVaibhavkumar  Arrie Eastern, MD vgv:sb D: Mar 11, 2013 14:46:08 ET Nielsen: 03/11/13 14:56:52 ET JOB#: [id number]  cc: Hope Pigeon. Elisabeth Pigeon, MD, <Dictator> Altamese Dilling MD ELECTRONICALLY SIGNED 03/17/2013 13:58

## 2014-09-09 NOTE — Consult Note (Signed)
Brief Consult Note: Diagnosis: hematemesis.   Patient was seen by consultant.   Recommend further assessment or treatment.   Discussed with Attending MD.   Comments: Case discusseed with Dr Amado CoeGouru this am, pateient seen in ccu about 430pm. Patient seen and exaimine, full consult to follow.  81 yo male admitted after multiple episodes of hematemesis that started yesterday, last being 3am this morning.  hemodynamically stable, no bm or emesis since 3am.  Patietn on eliquis for af, several recent admissions [facility]  for hip fx/pneumonia.  Long h/o severe copd.  Much information obtained from patients sone at bedside.  He states that he and he father had discussed intubation with patients pulmonologist a number of years ago, and they wish no intubation, and no proceedure that woudl risk that if possible.  I presented alternative management to patietn and his son.  ONe would be to plan for an egd probably tomorrow, with continued medical treatment.  The other would be to continue medical therapy (on iv ppi drip), and if continues to remain stable, would do a non invasive test in 10 days to 2 weeks.  I have discussed the risks of egd, particularly in light of Mr Carvers respiratory status.  They have decided to agree with the latter management.  I have stated to them that we can shange plans at any point if necessary.  I am recommending continued ICU observation through tomorrow, continue ppi drip, npo except ice.  Serial hgb, transfuse as needed.  Full consult to follow.  Electronic Signatures for Addendum Section:  Barnetta ChapelSkulskie, Martin (MD) (Signed Addendum 18-Oct-14 20:02)  please see full GI consult #[id number]   Electronic Signatures: Barnetta ChapelSkulskie, Martin (MD)  (Signed 18-Oct-14 19:43)  Authored: Brief Consult Note   Last Updated: 18-Oct-14 20:02 by Barnetta ChapelSkulskie, Martin (MD)

## 2014-09-09 NOTE — Consult Note (Signed)
PATIENT NAME:  Leroy Nielsen, Leroy Nielsen MR#:  [id number] DATE OF BIRTH:  [birth year]  DATE OF CONSULTATION:  11/26/2012  REFERRING PHYSICIAN:   CONSULTING PHYSICIAN:  Bransen Fassnacht A. Allena Katz, MD  PRIMARY CARE PHYSICIAN: Dr. Randa Lynn  PULMONOLOGIST: Dr. Nelva Nay in [place], contact number [phone].   CHIEF COMPLAINT: Fall with right hip pain.   HISTORY OF PRESENT ILLNESS: Leroy Nielsen is a pleasant 81 year old Caucasian gentleman with severe end-stage emphysema on home oxygen 3 L, hypertension and a history of diabetes, who comes to the Emergency Room after he sustained a mechanical fall at home while trying to pick up the newspaper. The patient said he lost balance, landing on his right hip and sustained a right hip fracture. He was brought to the Emergency Room where he does have significant hip pain. His sats are 96% on 2 L. Blood pressure is 158/68. Internal medicine was consulted for possibility of admission since the family reported the patient's pulmonologist recommended no surgery of any sort in the future given his end-stage emphysema.   PAST MEDICAL HISTORY:  1.  Severe end-stage emphysema/chronic respiratory failure on 3 L nasal cannula oxygen.  2.  Type 2 diabetes.  3.  BPH.  4.  Hypertension.   PAST SURGICAL HISTORY:  -CABG  MEDICATIONS:  1.  Aspirin 81 mg daily.  2.  Avodart 0.5 mg daily.  3.  Tamsulosin 0.4 mg daily.  4.  Theophylline 24 p.o. daily.  5.  Metoclopramide 10 mg 3 times a day.  6.  Metoprolol 50 mg 1 tablet daily.  7.  Qvar 80 mcg per inhalation b.i.d.  8.  Cartia XT 180 mg b.i.d.  9. Escitalopram 10 mg daily.  10. Lantus 15 units at bedtime.   ALLERGIES: DEMEROL AND PENICILLIN.   SOCIAL HISTORY: Lives at home by himself. No smoking. Denies any alcohol use. He is independent walking around, but with himself  and tries to fix his own meals. Family visits and keeps and eye on the patient.  REVIEW OF SYSTEMS: CONSTITUTIONAL: No fever. Positive for fatigue, weakness, and pain  in the right hip. EYES: No blurred or double vision. No glaucoma. ENT: No tinnitus, ear pain, hearing loss. RESPIRATORY: Positive for COPD and end-stage emphysema. CARDIOVASCULAR: No chest pain, orthopnea or edema. GASTROINTESTINAL: No nausea, vomiting, diarrhea, abdominal pain or hematemesis. GENITOURINARY: No dysuria, hematuria or frequency. ENDOCRINE: No polyuria, nocturia or thyroid problems. HEMATOLOGY: No anemia or easy bruising. SKIN: No acne. Positive for dry skin in both lower extremities. MUSCULOSKELETAL: Positive for arthritis and right hip pain. NEUROLOGIC: No CVA, TIA, ataxia or vertigo. PSYCHIATRIC: No anxiety or depression. All other systems reviewed are negative.   PHYSICAL EXAMINATION:  GENERAL: The patient is awake, alert, oriented x 3. He is in mild to moderate distress due to hip pain. VITAL SIGNS: Afebrile, pulse is 61, blood pressure is 140/65, sats are 98% on 3 L.  HEENT: Atraumatic, normocephalic. PERRLA. EOM intact. Oral mucosa is moist. The patient has a central cyanosis over the lips.  NECK: Supple. No JVD. No carotid bruit.  RESPIRATORY: Distant breath sounds. No wheezing. Decreased breath sounds at bases. No audible crackles or use of accessory muscles.  CARDIOVASCULAR: Both heart sounds are normal. Rate and rhythm regular. PMI not lateralized. Chest is nontender.  EXTREMITIES: Good pedal pulses, good femoral pulses. No lower extremity edema. The patient is extremely dry skin in both his lower extremities with scaling of the skin.  NEUROLOGIC: Grossly intact cranial nerves II through XII. No motor or  sensory deficit.  PSYCHIATRIC: The patient is awake, alert, oriented x 3.   Chest x-ray shows mild cardiomegaly with COPD with underlying fibrosis. Right hip shows subcapital right femur fracture. AP pelvis shows no bony acute abnormality. White count is 11.5, hemoglobin and hematocrit 14 and 41.8. Glucose is 161, BUN is 26, creatinine 1.73. Rest of the LFTs are within normal  limits. UA negative for UTI.   ASSESSMENT AND PLAN: This is an 81 year old, Leroy Nielsen, with history of end-stage chronic obstructive pulmonary disease/chronic respiratory failure, on chronic home oxygen, who comes in to the emergency room with:  1.  Right femoral subcapital fracture, status post mechanical fall at home. The patient bent down to pick up a newspaper and lost balance. Denies loss of consciousness, seizures or syncope. No chest pain or any other injuries. Care per orthopedics at [facility].  2.  Chronic obstructive pulmonary disease/emphysema on home oxygen 3 L nasal cannula. The patient uses oral inhalers. He is not currently in respiratory distress. Will continue his inhalers and oxygen. He is at high risk for surgery from pulmonary standpoint. The patient son spoke with his pulmonologist,  Dr. Cy BlamerKunnis, and the pulmonologist recommends the patient to be transferred to [facility] for further care.  3.  Type 2 diabetes, on Lantus.  4.  Hypertension, on Cartia XT.  5.  Benign prostatic hypertrophy on Avodart and Flomax.   The above was discussed with the patient's son and Emergency Room and they will try to facilitate transfer to [facility].   TIME SPENT: 55 minutes.  ____________________________ Wylie HailSona A. Allena KatzPatel, MD sap:aw D: 11/26/2012 13:08:59 ET T: 11/26/2012 13:27:08 ET JOB#: [id number]  cc: Altamese Deguire A. Allena KatzPatel, MD, <Dictator> Reola MosherAndrew S. Randa LynnLamb, MD Willow OraSONA A Linda Grimmer MD ELECTRONICALLY SIGNED 12/07/2012 19:01
